# Patient Record
Sex: Female | Born: 1945 | Race: White | Hispanic: No | Marital: Married | Smoking: Never smoker
Health system: Southern US, Community
[De-identification: ages and names within clinical notes are randomized; demographics above are authoritative.]

## PROBLEM LIST (undated history)

## (undated) HISTORY — PX: ABDOMINAL HYSTERECTOMY: SHX81

---

## 1988-03-22 HISTORY — PX: APPENDECTOMY: SHX54

## 2014-01-17 ENCOUNTER — Encounter (INDEPENDENT_AMBULATORY_CARE_PROVIDER_SITE_OTHER): Payer: Self-pay | Admitting: *Deleted

## 2015-04-23 ENCOUNTER — Encounter (INDEPENDENT_AMBULATORY_CARE_PROVIDER_SITE_OTHER): Payer: Self-pay | Admitting: *Deleted

## 2015-07-25 ENCOUNTER — Other Ambulatory Visit (INDEPENDENT_AMBULATORY_CARE_PROVIDER_SITE_OTHER): Payer: Self-pay | Admitting: *Deleted

## 2015-07-25 ENCOUNTER — Encounter (INDEPENDENT_AMBULATORY_CARE_PROVIDER_SITE_OTHER): Payer: Self-pay | Admitting: *Deleted

## 2015-07-25 DIAGNOSIS — Z1211 Encounter for screening for malignant neoplasm of colon: Secondary | ICD-10-CM

## 2015-09-24 ENCOUNTER — Other Ambulatory Visit (INDEPENDENT_AMBULATORY_CARE_PROVIDER_SITE_OTHER): Payer: Self-pay | Admitting: *Deleted

## 2015-09-24 ENCOUNTER — Encounter (INDEPENDENT_AMBULATORY_CARE_PROVIDER_SITE_OTHER): Payer: Self-pay | Admitting: *Deleted

## 2015-09-24 NOTE — Telephone Encounter (Signed)
Patient needs trilyte 

## 2015-09-25 MED ORDER — PEG 3350-KCL-NA BICARB-NACL 420 G PO SOLR: 4000.0000 mL | Freq: Once | ORAL | Status: DC

## 2015-10-08 ENCOUNTER — Telehealth (INDEPENDENT_AMBULATORY_CARE_PROVIDER_SITE_OTHER): Payer: Self-pay | Admitting: *Deleted

## 2015-10-08 NOTE — Telephone Encounter (Signed)
agere 

## 2015-10-08 NOTE — Telephone Encounter (Signed)
Referring MD: mcleod PCP: sasser   Procedure: tcs  Reason/Indication:  screening  Has patient had this procedure before?  Yes, 8-10 yrs ago  If so, when, by whom and where?    Is there a family history of colon cancer?  no  Who?  What age when diagnosed?    Is patient diabetic?   no      Does patient have prosthetic heart valve or mechanical valve?  no  Do you have a pacemaker?  no  Has patient ever had endocarditis? no  Has patient had joint replacement within last 12 months?  no  Does patient tend to be constipated or take laxatives? no  Does patient have a history of alcohol/drug use?  no  Is patient on Coumadin, Plavix and/or Aspirin? no  Medications: estradiol 1 mg daily, tums 1000 mg daily, tylenol ES daily, vit c 500 mg bid, glucosamine bid, multi vit daily, cranberry daily, fish oil 300 mg daily, ocuvite nightly, claritin 10 mg daily, opcon eye drop prn  Allergies: nkda  Medication Adjustment:   Procedure date & time: 10/30/15 at 930

## 2015-10-30 ENCOUNTER — Ambulatory Visit (HOSPITAL_COMMUNITY)
Admission: RE | Admit: 2015-10-30 | Discharge: 2015-10-30 | Disposition: A | Discharge status: Home / Self Care | Payer: PPO | Source: Ambulatory Visit | Attending: Internal Medicine | Admitting: Internal Medicine

## 2015-10-30 ENCOUNTER — Encounter (HOSPITAL_COMMUNITY): Payer: Self-pay

## 2015-10-30 ENCOUNTER — Encounter (HOSPITAL_COMMUNITY)
Admission: RE | Disposition: A | Discharge status: Home / Self Care | Payer: Self-pay | Source: Ambulatory Visit | Attending: Internal Medicine

## 2015-10-30 DIAGNOSIS — K644 Residual hemorrhoidal skin tags: Secondary | ICD-10-CM | POA: Insufficient documentation

## 2015-10-30 DIAGNOSIS — Z79899 Other long term (current) drug therapy: Secondary | ICD-10-CM | POA: Diagnosis not present

## 2015-10-30 DIAGNOSIS — K6289 Other specified diseases of anus and rectum: Secondary | ICD-10-CM | POA: Insufficient documentation

## 2015-10-30 DIAGNOSIS — D120 Benign neoplasm of cecum: Secondary | ICD-10-CM | POA: Diagnosis not present

## 2015-10-30 DIAGNOSIS — Z1211 Encounter for screening for malignant neoplasm of colon: Secondary | ICD-10-CM | POA: Insufficient documentation

## 2015-10-30 HISTORY — PX: COLONOSCOPY: SHX5424

## 2015-10-30 HISTORY — PX: POLYPECTOMY: SHX5525

## 2015-10-30 SURGERY — COLONOSCOPY
Anesthesia: Moderate Sedation

## 2015-10-30 MED ORDER — SODIUM CHLORIDE 0.9 % IV SOLN
INTRAVENOUS | Status: DC
  Administered 2015-10-30: 08:00:00 via INTRAVENOUS

## 2015-10-30 MED ORDER — MIDAZOLAM HCL 5 MG/5ML IJ SOLN
INTRAMUSCULAR | Status: DC | PRN
  Administered 2015-10-30 (×2): 1 mg via INTRAVENOUS
  Administered 2015-10-30 (×2): 2 mg via INTRAVENOUS

## 2015-10-30 MED ORDER — MEPERIDINE HCL 50 MG/ML IJ SOLN
INTRAMUSCULAR | Status: AC
  Filled 2015-10-30: qty 1

## 2015-10-30 MED ORDER — MIDAZOLAM HCL 5 MG/5ML IJ SOLN
INTRAMUSCULAR | Status: AC
  Filled 2015-10-30: qty 10

## 2015-10-30 MED ORDER — MEPERIDINE HCL 50 MG/ML IJ SOLN
INTRAMUSCULAR | Status: DC | PRN
  Administered 2015-10-30 (×2): 25 mg via INTRAVENOUS

## 2015-10-30 MED ORDER — STERILE WATER FOR IRRIGATION IR SOLN
Status: DC | PRN
  Administered 2015-10-30: 09:00:00

## 2015-10-30 NOTE — H&P (Signed)
Michelle Arias is an 70 y.o. female.   Chief Complaint: Patient is here for colonoscopy. HPI: Patient is [age]-year-old Caucasian female who is here for screening colonoscopy. She denies abdominal pain change in habits or rectal bleeding. Family history is negative for CRC.  History reviewed. No pertinent past medical history.  Past Surgical History:  Procedure Laterality Date  . ABDOMINAL HYSTERECTOMY    . APPENDECTOMY  1990    History reviewed. No pertinent family history. Social History:  reports that she has never smoked. She has never used smokeless tobacco. She reports that she does not drink alcohol or use drugs.  Allergies: No Known Allergies  Medications Prior to Admission  Medication Sig Dispense Refill  . acetaminophen (TYLENOL) 500 MG tablet Take 500 mg by mouth every 6 (six) hours as needed.    . calcium carbonate (TUMS - DOSED IN MG ELEMENTAL CALCIUM) 500 MG chewable tablet Chew 1 tablet by mouth daily.    Marland Kitchen co-enzyme Q-10 30 MG capsule Take 30 mg by mouth 3 (three) times daily.    Marland Kitchen estradiol (ESTRACE) 1 MG tablet Take 1 mg by mouth daily.    Marland Kitchen glucosamine-chondroitin 500-400 MG tablet Take 1 tablet by mouth 3 (three) times daily.    Marland Kitchen loratadine (CLARITIN) 10 MG tablet Take 10 mg by mouth daily.    . Multiple Vitamin (MULTIVITAMIN) tablet Take 1 tablet by mouth daily.    . multivitamin-lutein (OCUVITE-LUTEIN) CAPS capsule Take 1 capsule by mouth daily.    . polyethylene glycol-electrolytes (TRILYTE) 420 g solution Take 4,000 mLs by mouth once. 4000 mL 0    No results found for this or any previous visit (from the past 48 hour(s)). No results found.  ROS  Blood pressure (!) 149/82, pulse 99, temperature 98.1 F (36.7 C), temperature source Oral, resp. rate 16, height 5\' 5"  (1.651 m), weight 165 lb (74.8 kg), SpO2 99 %. Physical Exam  Constitutional: She appears well-developed and well-nourished.  HENT:  Mouth/Throat: Oropharynx is clear and moist.  Eyes:  Conjunctivae are normal. No scleral icterus.  Neck: No thyromegaly present.  Cardiovascular: Normal rate, regular rhythm and normal heart sounds.   No murmur heard. Respiratory: Effort normal and breath sounds normal.  GI: Soft. She exhibits no distension and no mass. There is no tenderness.  Musculoskeletal: She exhibits no edema.  Lymphadenopathy:    She has no cervical adenopathy.  Neurological: She is alert.  Skin: Skin is warm and dry.     Assessment/Plan Average risk screening colonoscopy.  Hildred Laser, MD 10/30/2015, 9:21 AM

## 2015-10-30 NOTE — Op Note (Signed)
[facility] Patient Name: Treba Arias Procedure Date: 10/30/2015 9:18 AM MRN: [id number] Date of Birth: 1945/07/05 Attending MD: Hildred Laser , MD CSN: [id number] Age: 70 Admit Type: Outpatient Procedure:                Colonoscopy Indications:              Screening for colorectal malignant neoplasm Providers:                Hildred Laser, MD, Otis Peak B. Sharon Seller, RN, Shelby Mattocks, Technician, Isabella Stalling, Technician Referring MD:             Manon Hilding MD, MD Medicines:                Meperidine 50 mg IV, Midazolam 6 mg IV Complications:            No immediate complications. Estimated Blood Loss:     Estimated blood loss was minimal. Procedure:                Pre-Anesthesia Assessment:                           - Prior to the procedure, a History and Physical                            was performed, and patient medications and                            allergies were reviewed. The patient's tolerance of                            previous anesthesia was also reviewed. The risks                            and benefits of the procedure and the sedation                            options and risks were discussed with the patient.                            All questions were answered, and informed consent                            was obtained. Prior Anticoagulants: The patient has                            taken no previous anticoagulant or antiplatelet                            agents. ASA Grade Assessment: I - A normal, healthy                            patient. After reviewing the risks and benefits,  the patient was deemed in satisfactory condition to                            undergo the procedure.                           After obtaining informed consent, the colonoscope                            was passed under direct vision. Throughout the                            procedure, the patient's blood  pressure, pulse, and                            oxygen saturations were monitored continuously. The                            EC-[id number]) scope was introduced through                            the anus and advanced to the the cecum, identified                            by appendiceal orifice and ileocecal valve. The                            colonoscopy was performed without difficulty. The                            patient tolerated the procedure well. The quality                            of the bowel preparation was good. The ileocecal                            valve, appendiceal orifice, and rectum were                            photographed. Scope In: 9:30:46 AM Scope Out: 9:52:18 AM Scope Withdrawal Time: 0 hours 14 minutes 56 seconds  Total Procedure Duration: 0 hours 21 minutes 32 seconds  Findings:      A 3 mm polyp was found in the cecum. The polyp was sessile. This was       biopsied with a cold forceps for histology. The pathology specimen was       placed into Bottle Number 1.      A 5 by 8 mm polyp was found in the cecum. The polyp was sessile. The       polyp was removed with a cold snare. Resection and retrieval were       complete. To stop active bleeding, one hemostatic clip was successfully       placed (MR conditional). There was no bleeding at the end of the       procedure. athology specimen was placed in bottle  1.      The exam was otherwise without abnormality.      External hemorrhoids were found during retroflexion. The hemorrhoids       were small.      Anal papilla(e) were hypertrophied. Impression:               - One 3 mm polyp in the cecum. Biopsied.                           - One 5 by 8 mm polyp in the cecum, removed with a                            cold snare. Resected and retrieved. Clip (MR                            conditional) was placed.                           - Both polyps were submi                           - The  examination was otherwise normal.                           - External hemorrhoids.                           - Anal papilla(e) were hypertrophied. Moderate Sedation:      Moderate (conscious) sedation was administered by the endoscopy nurse       and supervised by the endoscopist. The following parameters were       monitored: oxygen saturation, heart rate, blood pressure, CO2       capnography and response to care. Total physician intraservice time was       27 minutes. Recommendation:           - Patient has a contact number available for                            emergencies. The signs and symptoms of potential                            delayed complications were discussed with the                            patient. Return to normal activities tomorrow.                            Written discharge instructions were provided to the                            patient.                           - Resume previous diet today.                           -  Continue present medications.                           - No aspirin, ibuprofen, naproxen, or other                            non-steroidal anti-inflammatory drugs for 5 days                            after polyp removal.                           - Await pathology results.                           - Repeat colonoscopy for surveillance based on                            pathology results. Procedure Code(s):        --- Professional ---                           [phone], Colonoscopy, flexible; with removal of                            tumor(s), polyp(s), or other lesion(s) by snare                            technique                           45380, 59, Colonoscopy, flexible; with biopsy,                            single or multiple                           99152, Moderate sedation services provided by the                            same physician or other qualified health care                            professional performing  the diagnostic or                            therapeutic service that the sedation supports,                            requiring the presence of an independent trained                            observer to assist in the monitoring of the                            patient's level of consciousness and physiological  status; initial 15 minutes of intraservice time,                            patient age 25 years or older                           [phone], Moderate sedation services; each additional                            15 minutes intraservice time Diagnosis Code(s):        --- Professional ---                           Z12.11, Encounter for screening for malignant                            neoplasm of colon                           D12.0, Benign neoplasm of cecum                           K64.4, Residual hemorrhoidal skin tags                           K62.89, Other specified diseases of anus and rectum CPT copyright 2016 American Medical Association. All rights reserved. The codes documented in this report are preliminary and upon coder review may  be revised to meet current compliance requirements. Hildred Laser, MD Hildred Laser, MD 10/30/2015 10:01:06 AM This report has been signed electronically. Number of Addenda: 0

## 2015-10-30 NOTE — Discharge Instructions (Signed)
No aspirin or NSAIDs for 5 days. Resume usual medications and diet. No driving for 24 hours. Physician will call with biopsy results.   Colonoscopy, Care After These instructions give you information on caring for yourself after your procedure. Your doctor may also give you more specific instructions. Call your doctor if you have any problems or questions after your procedure. HOME CARE  Do not drive for 24 hours.  Do not sign important papers or use machinery for 24 hours.  You may shower.  You may go back to your usual activities, but go slower for the first 24 hours.  Take rest breaks often during the first 24 hours.  Walk around or use warm packs on your belly (abdomen) if you have belly cramping or gas.  Drink enough fluids to keep your pee (urine) clear or pale yellow.  Resume your normal diet. Avoid heavy or fried foods.  Avoid drinking alcohol for 24 hours or as told by your doctor.  Only take medicines as told by your doctor. If a tissue sample (biopsy) was taken during the procedure:   Do not take aspirin or blood thinners for 7 days, or as told by your doctor.  Do not drink alcohol for 7 days, or as told by your doctor.  Eat soft foods for the first 24 hours. GET HELP IF: You still have a small amount of blood in your poop (stool) 2-3 days after the procedure. GET HELP RIGHT AWAY IF:  You have more than a small amount of blood in your poop.  You see clumps of tissue (blood clots) in your poop.  Your belly is puffy (swollen).  You feel sick to your stomach (nauseous) or throw up (vomit).  You have a fever.  You have belly pain that gets worse and medicine does not help. MAKE SURE YOU:  Understand these instructions.  Will watch your condition.  Will get help right away if you are not doing well or get worse.   This information is not intended to replace advice given to you by your health care provider. Make sure you discuss any questions you have  with your health care provider.   Document Released: 04/10/2010 Document Revised: 03/13/2013 Document Reviewed: 11/13/2012 Elsevier Interactive Patient Education 2016 Elsevier Inc[employer]  Colon Polyps Polyps are lumps of extra tissue growing inside the body. Polyps can grow in the large intestine (colon). Most colon polyps are noncancerous (benign). However, some colon polyps can become cancerous over time. Polyps that are larger than a pea may be harmful. To be safe, caregivers remove and test all polyps. CAUSES  Polyps form when mutations in the genes cause your cells to grow and divide even though no more tissue is needed. RISK FACTORS There are a number of risk factors that can increase your chances of getting colon polyps. They include:  Being older than 50 years.  Family history of colon polyps or colon cancer.  Long-term colon diseases, such as colitis or Crohn disease.  Being overweight.  Smoking.  Being inactive.  Drinking too much alcohol. SYMPTOMS  Most small polyps do not cause symptoms. If symptoms are present, they may include:  Blood in the stool. The stool may look dark red or black.  Constipation or diarrhea that lasts longer than 1 week. DIAGNOSIS People often do not know they have polyps until their caregiver finds them during a regular checkup. Your caregiver can use 4 tests to check for polyps:  Digital rectal exam. The caregiver wears  gloves and feels inside the rectum. This test would find polyps only in the rectum.  Barium enema. The caregiver puts a liquid called barium into your rectum before taking X-rays of your colon. Barium makes your colon look white. Polyps are dark, so they are easy to see in the X-ray pictures.  Sigmoidoscopy. A thin, flexible tube (sigmoidoscope) is placed into your rectum. The sigmoidoscope has a light and tiny camera in it. The caregiver uses the sigmoidoscope to look at the last third of your colon.  Colonoscopy. This test is  like sigmoidoscopy, but the caregiver looks at the entire colon. This is the most common method for finding and removing polyps. TREATMENT  Any polyps will be removed during a sigmoidoscopy or colonoscopy. The polyps are then tested for cancer. PREVENTION  To help lower your risk of getting more colon polyps:  Eat plenty of fruits and vegetables. Avoid eating fatty foods.  Do not smoke.  Avoid drinking alcohol.  Exercise every day.  Lose weight if recommended by your caregiver.  Eat plenty of calcium and folate. Foods that are rich in calcium include milk, cheese, and broccoli. Foods that are rich in folate include chickpeas, kidney beans, and spinach. HOME CARE INSTRUCTIONS Keep all follow-up appointments as directed by your caregiver. You may need periodic exams to check for polyps. SEEK MEDICAL CARE IF: You notice bleeding during a bowel movement.   This information is not intended to replace advice given to you by your health care provider. Make sure you discuss any questions you have with your health care provider.   Document Released: 12/03/2003 Document Revised: 03/29/2014 Document Reviewed: 05/18/2011 Elsevier Interactive Patient Education 2016 [employer].   Hemorrhoids Hemorrhoids are swollen veins around the rectum or anus. There are two types of hemorrhoids:   Internal hemorrhoids. These occur in the veins just inside the rectum. They may poke through to the outside and become irritated and painful.  External hemorrhoids. These occur in the veins outside the anus and can be felt as a painful swelling or hard lump near the anus. CAUSES  Pregnancy.   Obesity.   Constipation or diarrhea.   Straining to have a bowel movement.   Sitting for long periods on the toilet.  Heavy lifting or other activity that caused you to strain.  Anal intercourse. SYMPTOMS   Pain.   Anal itching or irritation.   Rectal bleeding.   Fecal leakage.   Anal  swelling.   One or more lumps around the anus.  DIAGNOSIS  Your caregiver may be able to diagnose hemorrhoids by visual examination. Other examinations or tests that may be performed include:   Examination of the rectal area with a gloved hand (digital rectal exam).   Examination of anal canal using a small tube (scope).   A blood test if you have lost a significant amount of blood.  A test to look inside the colon (sigmoidoscopy or colonoscopy). TREATMENT Most hemorrhoids can be treated at home. However, if symptoms do not seem to be getting better or if you have a lot of rectal bleeding, your caregiver may perform a procedure to help make the hemorrhoids get smaller or remove them completely. Possible treatments include:   Placing a rubber band at the base of the hemorrhoid to cut off the circulation (rubber band ligation).   Injecting a chemical to shrink the hemorrhoid (sclerotherapy).   Using a tool to burn the hemorrhoid (infrared light therapy).   Surgically removing the hemorrhoid (hemorrhoidectomy).  Stapling the hemorrhoid to block blood flow to the tissue (hemorrhoid stapling).  HOME CARE INSTRUCTIONS   Eat foods with fiber, such as whole grains, beans, nuts, fruits, and vegetables. Ask your doctor about taking products with added fiber in them (fibersupplements).  Increase fluid intake. Drink enough water and fluids to keep your urine clear or pale yellow.   Exercise regularly.   Go to the bathroom when you have the urge to have a bowel movement. Do not wait.   Avoid straining to have bowel movements.   Keep the anal area dry and clean. Use wet toilet paper or moist towelettes after a bowel movement.   Medicated creams and suppositories may be used or applied as directed.   Only take over-the-counter or prescription medicines as directed by your caregiver.   Take warm sitz baths for 15-20 minutes, 3-4 times a day to ease pain and discomfort.    Place ice packs on the hemorrhoids if they are tender and swollen. Using ice packs between sitz baths may be helpful.   Put ice in a plastic bag.   Place a towel between your skin and the bag.   Leave the ice on for 15-20 minutes, 3-4 times a day.   Do not use a donut-shaped pillow or sit on the toilet for long periods. This increases blood pooling and pain.  SEEK MEDICAL CARE IF:  You have increasing pain and swelling that is not controlled by treatment or medicine.  You have uncontrolled bleeding.  You have difficulty or you are unable to have a bowel movement.  You have pain or inflammation outside the area of the hemorrhoids. MAKE SURE YOU:  Understand these instructions.  Will watch your condition.  Will get help right away if you are not doing well or get worse.   This information is not intended to replace advice given to you by your health care provider. Make sure you discuss any questions you have with your health care provider.   Document Released: 03/05/2000 Document Revised: 02/23/2012 Document Reviewed: 01/11/2012 Elsevier Interactive Patient Education [employer].

## 2015-11-03 ENCOUNTER — Encounter (HOSPITAL_COMMUNITY): Payer: Self-pay | Admitting: Internal Medicine

## 2016-03-18 DIAGNOSIS — Z1231 Encounter for screening mammogram for malignant neoplasm of breast: Secondary | ICD-10-CM | POA: Diagnosis not present

## 2016-03-25 DIAGNOSIS — Z6829 Body mass index (BMI) 29.0-29.9, adult: Secondary | ICD-10-CM | POA: Diagnosis not present

## 2016-03-25 DIAGNOSIS — Z01419 Encounter for gynecological examination (general) (routine) without abnormal findings: Secondary | ICD-10-CM | POA: Diagnosis not present

## 2016-03-25 DIAGNOSIS — R1012 Left upper quadrant pain: Secondary | ICD-10-CM | POA: Diagnosis not present

## 2016-03-25 DIAGNOSIS — Z1272 Encounter for screening for malignant neoplasm of vagina: Secondary | ICD-10-CM | POA: Diagnosis not present

## 2016-03-25 DIAGNOSIS — L900 Lichen sclerosus et atrophicus: Secondary | ICD-10-CM | POA: Diagnosis not present

## 2016-04-08 DIAGNOSIS — R1012 Left upper quadrant pain: Secondary | ICD-10-CM | POA: Diagnosis not present

## 2016-04-08 DIAGNOSIS — K7689 Other specified diseases of liver: Secondary | ICD-10-CM | POA: Diagnosis not present

## 2016-04-08 DIAGNOSIS — Z6829 Body mass index (BMI) 29.0-29.9, adult: Secondary | ICD-10-CM | POA: Diagnosis not present

## 2017-02-03 DIAGNOSIS — M79671 Pain in right foot: Secondary | ICD-10-CM | POA: Diagnosis not present

## 2017-02-03 DIAGNOSIS — M722 Plantar fascial fibromatosis: Secondary | ICD-10-CM | POA: Diagnosis not present

## 2017-02-11 DIAGNOSIS — M722 Plantar fascial fibromatosis: Secondary | ICD-10-CM | POA: Diagnosis not present

## 2017-02-11 DIAGNOSIS — K219 Gastro-esophageal reflux disease without esophagitis: Secondary | ICD-10-CM | POA: Diagnosis not present

## 2017-02-11 DIAGNOSIS — Z299 Encounter for prophylactic measures, unspecified: Secondary | ICD-10-CM | POA: Diagnosis not present

## 2017-02-11 DIAGNOSIS — Z79890 Hormone replacement therapy (postmenopausal): Secondary | ICD-10-CM | POA: Diagnosis not present

## 2017-02-11 DIAGNOSIS — J309 Allergic rhinitis, unspecified: Secondary | ICD-10-CM | POA: Diagnosis not present

## 2017-02-11 DIAGNOSIS — Z6832 Body mass index (BMI) 32.0-32.9, adult: Secondary | ICD-10-CM | POA: Diagnosis not present

## 2017-03-03 DIAGNOSIS — M79672 Pain in left foot: Secondary | ICD-10-CM | POA: Diagnosis not present

## 2017-03-03 DIAGNOSIS — M722 Plantar fascial fibromatosis: Secondary | ICD-10-CM | POA: Diagnosis not present

## 2017-03-04 DIAGNOSIS — Z7189 Other specified counseling: Secondary | ICD-10-CM | POA: Diagnosis not present

## 2017-03-04 DIAGNOSIS — E7800 Pure hypercholesterolemia, unspecified: Secondary | ICD-10-CM | POA: Diagnosis not present

## 2017-03-04 DIAGNOSIS — Z1331 Encounter for screening for depression: Secondary | ICD-10-CM | POA: Diagnosis not present

## 2017-03-04 DIAGNOSIS — Z0000 Encounter for general adult medical examination without abnormal findings: Secondary | ICD-10-CM | POA: Diagnosis not present

## 2017-03-04 DIAGNOSIS — Z299 Encounter for prophylactic measures, unspecified: Secondary | ICD-10-CM | POA: Diagnosis not present

## 2017-03-04 DIAGNOSIS — Z1339 Encounter for screening examination for other mental health and behavioral disorders: Secondary | ICD-10-CM | POA: Diagnosis not present

## 2017-03-04 DIAGNOSIS — Z87891 Personal history of nicotine dependence: Secondary | ICD-10-CM | POA: Diagnosis not present

## 2017-03-04 DIAGNOSIS — Z79899 Other long term (current) drug therapy: Secondary | ICD-10-CM | POA: Diagnosis not present

## 2017-03-04 DIAGNOSIS — R5383 Other fatigue: Secondary | ICD-10-CM | POA: Diagnosis not present

## 2017-03-04 DIAGNOSIS — Z6832 Body mass index (BMI) 32.0-32.9, adult: Secondary | ICD-10-CM | POA: Diagnosis not present

## 2017-03-04 DIAGNOSIS — E2839 Other primary ovarian failure: Secondary | ICD-10-CM | POA: Diagnosis not present

## 2017-03-08 DIAGNOSIS — R5383 Other fatigue: Secondary | ICD-10-CM | POA: Diagnosis not present

## 2017-03-08 DIAGNOSIS — E7800 Pure hypercholesterolemia, unspecified: Secondary | ICD-10-CM | POA: Diagnosis not present

## 2017-03-08 DIAGNOSIS — Z79899 Other long term (current) drug therapy: Secondary | ICD-10-CM | POA: Diagnosis not present

## 2017-03-24 DIAGNOSIS — E2839 Other primary ovarian failure: Secondary | ICD-10-CM | POA: Diagnosis not present

## 2017-03-29 DIAGNOSIS — Z01419 Encounter for gynecological examination (general) (routine) without abnormal findings: Secondary | ICD-10-CM | POA: Diagnosis not present

## 2017-04-01 DIAGNOSIS — Z6831 Body mass index (BMI) 31.0-31.9, adult: Secondary | ICD-10-CM | POA: Diagnosis not present

## 2017-04-01 DIAGNOSIS — L570 Actinic keratosis: Secondary | ICD-10-CM | POA: Diagnosis not present

## 2017-04-01 DIAGNOSIS — B079 Viral wart, unspecified: Secondary | ICD-10-CM | POA: Diagnosis not present

## 2017-04-01 DIAGNOSIS — B078 Other viral warts: Secondary | ICD-10-CM | POA: Diagnosis not present

## 2017-04-01 DIAGNOSIS — Z1231 Encounter for screening mammogram for malignant neoplasm of breast: Secondary | ICD-10-CM | POA: Diagnosis not present

## 2017-04-01 DIAGNOSIS — L820 Inflamed seborrheic keratosis: Secondary | ICD-10-CM | POA: Diagnosis not present

## 2017-04-01 DIAGNOSIS — Z299 Encounter for prophylactic measures, unspecified: Secondary | ICD-10-CM | POA: Diagnosis not present

## 2017-04-27 DIAGNOSIS — B079 Viral wart, unspecified: Secondary | ICD-10-CM | POA: Diagnosis not present

## 2017-04-27 DIAGNOSIS — Z6831 Body mass index (BMI) 31.0-31.9, adult: Secondary | ICD-10-CM | POA: Diagnosis not present

## 2017-04-27 DIAGNOSIS — Z299 Encounter for prophylactic measures, unspecified: Secondary | ICD-10-CM | POA: Diagnosis not present

## 2017-05-09 DIAGNOSIS — Z1329 Encounter for screening for other suspected endocrine disorder: Secondary | ICD-10-CM | POA: Diagnosis not present

## 2017-05-09 DIAGNOSIS — E088 Diabetes mellitus due to underlying condition with unspecified complications: Secondary | ICD-10-CM | POA: Diagnosis not present

## 2017-05-09 DIAGNOSIS — I4891 Unspecified atrial fibrillation: Secondary | ICD-10-CM | POA: Diagnosis not present

## 2017-05-09 DIAGNOSIS — Z13220 Encounter for screening for lipoid disorders: Secondary | ICD-10-CM | POA: Diagnosis not present

## 2017-05-09 DIAGNOSIS — E785 Hyperlipidemia, unspecified: Secondary | ICD-10-CM | POA: Diagnosis not present

## 2017-05-09 DIAGNOSIS — I10 Essential (primary) hypertension: Secondary | ICD-10-CM | POA: Diagnosis not present

## 2017-08-30 DIAGNOSIS — H43822 Vitreomacular adhesion, left eye: Secondary | ICD-10-CM | POA: Diagnosis not present

## 2017-08-30 DIAGNOSIS — H40013 Open angle with borderline findings, low risk, bilateral: Secondary | ICD-10-CM | POA: Diagnosis not present

## 2017-08-30 DIAGNOSIS — H2513 Age-related nuclear cataract, bilateral: Secondary | ICD-10-CM | POA: Diagnosis not present

## 2018-02-09 DIAGNOSIS — I480 Paroxysmal atrial fibrillation: Secondary | ICD-10-CM | POA: Diagnosis not present

## 2018-02-09 DIAGNOSIS — E088 Diabetes mellitus due to underlying condition with unspecified complications: Secondary | ICD-10-CM | POA: Diagnosis not present

## 2018-02-09 DIAGNOSIS — I10 Essential (primary) hypertension: Secondary | ICD-10-CM | POA: Diagnosis not present

## 2018-02-09 DIAGNOSIS — M24572 Contracture, left ankle: Secondary | ICD-10-CM | POA: Diagnosis not present

## 2018-02-09 DIAGNOSIS — G4730 Sleep apnea, unspecified: Secondary | ICD-10-CM | POA: Diagnosis not present

## 2018-03-08 DIAGNOSIS — Z6831 Body mass index (BMI) 31.0-31.9, adult: Secondary | ICD-10-CM | POA: Diagnosis not present

## 2018-03-08 DIAGNOSIS — Z1211 Encounter for screening for malignant neoplasm of colon: Secondary | ICD-10-CM | POA: Diagnosis not present

## 2018-03-08 DIAGNOSIS — Z1331 Encounter for screening for depression: Secondary | ICD-10-CM | POA: Diagnosis not present

## 2018-03-08 DIAGNOSIS — Z79899 Other long term (current) drug therapy: Secondary | ICD-10-CM | POA: Diagnosis not present

## 2018-03-08 DIAGNOSIS — Z0000 Encounter for general adult medical examination without abnormal findings: Secondary | ICD-10-CM | POA: Diagnosis not present

## 2018-03-08 DIAGNOSIS — Z1339 Encounter for screening examination for other mental health and behavioral disorders: Secondary | ICD-10-CM | POA: Diagnosis not present

## 2018-03-08 DIAGNOSIS — R5383 Other fatigue: Secondary | ICD-10-CM | POA: Diagnosis not present

## 2018-03-08 DIAGNOSIS — Z7189 Other specified counseling: Secondary | ICD-10-CM | POA: Diagnosis not present

## 2018-03-08 DIAGNOSIS — Z299 Encounter for prophylactic measures, unspecified: Secondary | ICD-10-CM | POA: Diagnosis not present

## 2018-03-08 DIAGNOSIS — E7800 Pure hypercholesterolemia, unspecified: Secondary | ICD-10-CM | POA: Diagnosis not present

## 2018-04-03 DIAGNOSIS — Z1231 Encounter for screening mammogram for malignant neoplasm of breast: Secondary | ICD-10-CM | POA: Diagnosis not present

## 2018-05-06 DIAGNOSIS — J0100 Acute maxillary sinusitis, unspecified: Secondary | ICD-10-CM | POA: Diagnosis not present

## 2018-05-06 DIAGNOSIS — R509 Fever, unspecified: Secondary | ICD-10-CM | POA: Diagnosis not present

## 2018-05-19 DIAGNOSIS — Z6831 Body mass index (BMI) 31.0-31.9, adult: Secondary | ICD-10-CM | POA: Diagnosis not present

## 2018-05-19 DIAGNOSIS — I10 Essential (primary) hypertension: Secondary | ICD-10-CM | POA: Diagnosis not present

## 2018-05-19 DIAGNOSIS — Z299 Encounter for prophylactic measures, unspecified: Secondary | ICD-10-CM | POA: Diagnosis not present

## 2018-09-12 DIAGNOSIS — H40013 Open angle with borderline findings, low risk, bilateral: Secondary | ICD-10-CM | POA: Diagnosis not present

## 2018-09-12 DIAGNOSIS — H2513 Age-related nuclear cataract, bilateral: Secondary | ICD-10-CM | POA: Diagnosis not present

## 2018-09-12 DIAGNOSIS — H43822 Vitreomacular adhesion, left eye: Secondary | ICD-10-CM | POA: Diagnosis not present

## 2019-03-14 DIAGNOSIS — R5383 Other fatigue: Secondary | ICD-10-CM | POA: Diagnosis not present

## 2019-03-14 DIAGNOSIS — E7800 Pure hypercholesterolemia, unspecified: Secondary | ICD-10-CM | POA: Diagnosis not present

## 2019-03-14 DIAGNOSIS — Z6831 Body mass index (BMI) 31.0-31.9, adult: Secondary | ICD-10-CM | POA: Diagnosis not present

## 2019-03-14 DIAGNOSIS — Z299 Encounter for prophylactic measures, unspecified: Secondary | ICD-10-CM | POA: Diagnosis not present

## 2019-03-14 DIAGNOSIS — Z0000 Encounter for general adult medical examination without abnormal findings: Secondary | ICD-10-CM | POA: Diagnosis not present

## 2019-03-14 DIAGNOSIS — I10 Essential (primary) hypertension: Secondary | ICD-10-CM | POA: Diagnosis not present

## 2019-03-14 DIAGNOSIS — Z79899 Other long term (current) drug therapy: Secondary | ICD-10-CM | POA: Diagnosis not present

## 2019-03-14 DIAGNOSIS — Z1211 Encounter for screening for malignant neoplasm of colon: Secondary | ICD-10-CM | POA: Diagnosis not present

## 2019-03-14 DIAGNOSIS — Z1339 Encounter for screening examination for other mental health and behavioral disorders: Secondary | ICD-10-CM | POA: Diagnosis not present

## 2019-03-14 DIAGNOSIS — Z7189 Other specified counseling: Secondary | ICD-10-CM | POA: Diagnosis not present

## 2019-03-14 DIAGNOSIS — Z1331 Encounter for screening for depression: Secondary | ICD-10-CM | POA: Diagnosis not present

## 2019-05-28 DIAGNOSIS — Z1231 Encounter for screening mammogram for malignant neoplasm of breast: Secondary | ICD-10-CM | POA: Diagnosis not present

## 2019-06-06 DIAGNOSIS — R922 Inconclusive mammogram: Secondary | ICD-10-CM | POA: Diagnosis not present

## 2019-06-06 DIAGNOSIS — N6489 Other specified disorders of breast: Secondary | ICD-10-CM | POA: Diagnosis not present

## 2019-06-12 DIAGNOSIS — Z01419 Encounter for gynecological examination (general) (routine) without abnormal findings: Secondary | ICD-10-CM | POA: Diagnosis not present

## 2019-06-21 DIAGNOSIS — Z87891 Personal history of nicotine dependence: Secondary | ICD-10-CM | POA: Diagnosis not present

## 2019-06-21 DIAGNOSIS — I10 Essential (primary) hypertension: Secondary | ICD-10-CM | POA: Diagnosis not present

## 2019-06-21 DIAGNOSIS — K219 Gastro-esophageal reflux disease without esophagitis: Secondary | ICD-10-CM | POA: Diagnosis not present

## 2019-06-21 DIAGNOSIS — Z299 Encounter for prophylactic measures, unspecified: Secondary | ICD-10-CM | POA: Diagnosis not present

## 2019-07-20 DIAGNOSIS — I10 Essential (primary) hypertension: Secondary | ICD-10-CM | POA: Diagnosis not present

## 2019-08-01 DIAGNOSIS — Z299 Encounter for prophylactic measures, unspecified: Secondary | ICD-10-CM | POA: Diagnosis not present

## 2019-08-01 DIAGNOSIS — Z6831 Body mass index (BMI) 31.0-31.9, adult: Secondary | ICD-10-CM | POA: Diagnosis not present

## 2019-08-01 DIAGNOSIS — M25561 Pain in right knee: Secondary | ICD-10-CM | POA: Diagnosis not present

## 2019-08-01 DIAGNOSIS — E7800 Pure hypercholesterolemia, unspecified: Secondary | ICD-10-CM | POA: Diagnosis not present

## 2019-08-01 DIAGNOSIS — I10 Essential (primary) hypertension: Secondary | ICD-10-CM | POA: Diagnosis not present

## 2019-08-19 DIAGNOSIS — I10 Essential (primary) hypertension: Secondary | ICD-10-CM | POA: Diagnosis not present

## 2019-09-18 DIAGNOSIS — I10 Essential (primary) hypertension: Secondary | ICD-10-CM | POA: Diagnosis not present

## 2019-09-25 DIAGNOSIS — H43822 Vitreomacular adhesion, left eye: Secondary | ICD-10-CM | POA: Diagnosis not present

## 2019-09-25 DIAGNOSIS — H40013 Open angle with borderline findings, low risk, bilateral: Secondary | ICD-10-CM | POA: Diagnosis not present

## 2019-09-25 DIAGNOSIS — H2513 Age-related nuclear cataract, bilateral: Secondary | ICD-10-CM | POA: Diagnosis not present

## 2019-09-25 DIAGNOSIS — D3192 Benign neoplasm of unspecified part of left eye: Secondary | ICD-10-CM | POA: Diagnosis not present

## 2019-09-26 DIAGNOSIS — I10 Essential (primary) hypertension: Secondary | ICD-10-CM | POA: Diagnosis not present

## 2019-09-26 DIAGNOSIS — Z713 Dietary counseling and surveillance: Secondary | ICD-10-CM | POA: Diagnosis not present

## 2019-09-26 DIAGNOSIS — Z299 Encounter for prophylactic measures, unspecified: Secondary | ICD-10-CM | POA: Diagnosis not present

## 2019-09-26 DIAGNOSIS — E7800 Pure hypercholesterolemia, unspecified: Secondary | ICD-10-CM | POA: Diagnosis not present

## 2019-09-26 DIAGNOSIS — Z6831 Body mass index (BMI) 31.0-31.9, adult: Secondary | ICD-10-CM | POA: Diagnosis not present

## 2019-10-19 DIAGNOSIS — I10 Essential (primary) hypertension: Secondary | ICD-10-CM | POA: Diagnosis not present

## 2019-11-20 DIAGNOSIS — I10 Essential (primary) hypertension: Secondary | ICD-10-CM | POA: Diagnosis not present

## 2019-12-20 DIAGNOSIS — I10 Essential (primary) hypertension: Secondary | ICD-10-CM | POA: Diagnosis not present

## 2020-01-19 DIAGNOSIS — I10 Essential (primary) hypertension: Secondary | ICD-10-CM | POA: Diagnosis not present

## 2020-01-22 DIAGNOSIS — M25561 Pain in right knee: Secondary | ICD-10-CM | POA: Diagnosis not present

## 2020-01-22 DIAGNOSIS — M170 Bilateral primary osteoarthritis of knee: Secondary | ICD-10-CM | POA: Diagnosis not present

## 2020-01-22 DIAGNOSIS — S83249A Other tear of medial meniscus, current injury, unspecified knee, initial encounter: Secondary | ICD-10-CM | POA: Diagnosis not present

## 2020-01-22 DIAGNOSIS — M25562 Pain in left knee: Secondary | ICD-10-CM | POA: Diagnosis not present

## 2020-02-19 DIAGNOSIS — I10 Essential (primary) hypertension: Secondary | ICD-10-CM | POA: Diagnosis not present

## 2020-03-17 DIAGNOSIS — Z6830 Body mass index (BMI) 30.0-30.9, adult: Secondary | ICD-10-CM | POA: Diagnosis not present

## 2020-03-17 DIAGNOSIS — Z1339 Encounter for screening examination for other mental health and behavioral disorders: Secondary | ICD-10-CM | POA: Diagnosis not present

## 2020-03-17 DIAGNOSIS — Z0000 Encounter for general adult medical examination without abnormal findings: Secondary | ICD-10-CM | POA: Diagnosis not present

## 2020-03-17 DIAGNOSIS — Z79899 Other long term (current) drug therapy: Secondary | ICD-10-CM | POA: Diagnosis not present

## 2020-03-17 DIAGNOSIS — E7800 Pure hypercholesterolemia, unspecified: Secondary | ICD-10-CM | POA: Diagnosis not present

## 2020-03-17 DIAGNOSIS — Z7189 Other specified counseling: Secondary | ICD-10-CM | POA: Diagnosis not present

## 2020-03-17 DIAGNOSIS — R5383 Other fatigue: Secondary | ICD-10-CM | POA: Diagnosis not present

## 2020-03-17 DIAGNOSIS — I10 Essential (primary) hypertension: Secondary | ICD-10-CM | POA: Diagnosis not present

## 2020-03-17 DIAGNOSIS — Z299 Encounter for prophylactic measures, unspecified: Secondary | ICD-10-CM | POA: Diagnosis not present

## 2020-03-17 DIAGNOSIS — Z1331 Encounter for screening for depression: Secondary | ICD-10-CM | POA: Diagnosis not present

## 2020-03-20 DIAGNOSIS — I10 Essential (primary) hypertension: Secondary | ICD-10-CM | POA: Diagnosis not present

## 2020-04-21 DIAGNOSIS — I10 Essential (primary) hypertension: Secondary | ICD-10-CM | POA: Diagnosis not present

## 2020-05-19 DIAGNOSIS — I10 Essential (primary) hypertension: Secondary | ICD-10-CM | POA: Diagnosis not present

## 2020-06-19 DIAGNOSIS — I10 Essential (primary) hypertension: Secondary | ICD-10-CM | POA: Diagnosis not present

## 2020-07-18 DIAGNOSIS — I10 Essential (primary) hypertension: Secondary | ICD-10-CM | POA: Diagnosis not present

## 2020-07-30 DIAGNOSIS — E669 Obesity, unspecified: Secondary | ICD-10-CM | POA: Diagnosis not present

## 2020-07-30 DIAGNOSIS — Z299 Encounter for prophylactic measures, unspecified: Secondary | ICD-10-CM | POA: Diagnosis not present

## 2020-07-30 DIAGNOSIS — I10 Essential (primary) hypertension: Secondary | ICD-10-CM | POA: Diagnosis not present

## 2020-09-25 DIAGNOSIS — H43821 Vitreomacular adhesion, right eye: Secondary | ICD-10-CM | POA: Diagnosis not present

## 2020-09-25 DIAGNOSIS — H40013 Open angle with borderline findings, low risk, bilateral: Secondary | ICD-10-CM | POA: Diagnosis not present

## 2020-09-25 DIAGNOSIS — D3192 Benign neoplasm of unspecified part of left eye: Secondary | ICD-10-CM | POA: Diagnosis not present

## 2020-09-25 DIAGNOSIS — H04123 Dry eye syndrome of bilateral lacrimal glands: Secondary | ICD-10-CM | POA: Diagnosis not present

## 2020-09-25 DIAGNOSIS — H2513 Age-related nuclear cataract, bilateral: Secondary | ICD-10-CM | POA: Diagnosis not present

## 2020-10-14 ENCOUNTER — Encounter (INDEPENDENT_AMBULATORY_CARE_PROVIDER_SITE_OTHER): Payer: Self-pay | Admitting: *Deleted

## 2020-10-27 DIAGNOSIS — J019 Acute sinusitis, unspecified: Secondary | ICD-10-CM | POA: Diagnosis not present

## 2020-10-27 DIAGNOSIS — Z299 Encounter for prophylactic measures, unspecified: Secondary | ICD-10-CM | POA: Diagnosis not present

## 2020-11-04 DIAGNOSIS — H6592 Unspecified nonsuppurative otitis media, left ear: Secondary | ICD-10-CM | POA: Diagnosis not present

## 2020-11-04 DIAGNOSIS — I10 Essential (primary) hypertension: Secondary | ICD-10-CM | POA: Diagnosis not present

## 2020-11-04 DIAGNOSIS — Z299 Encounter for prophylactic measures, unspecified: Secondary | ICD-10-CM | POA: Diagnosis not present

## 2020-11-04 DIAGNOSIS — H9319 Tinnitus, unspecified ear: Secondary | ICD-10-CM | POA: Diagnosis not present

## 2020-11-18 DIAGNOSIS — I10 Essential (primary) hypertension: Secondary | ICD-10-CM | POA: Diagnosis not present

## 2020-11-18 DIAGNOSIS — H6592 Unspecified nonsuppurative otitis media, left ear: Secondary | ICD-10-CM | POA: Diagnosis not present

## 2020-11-18 DIAGNOSIS — Z299 Encounter for prophylactic measures, unspecified: Secondary | ICD-10-CM | POA: Diagnosis not present

## 2020-11-18 DIAGNOSIS — H9319 Tinnitus, unspecified ear: Secondary | ICD-10-CM | POA: Diagnosis not present

## 2020-11-18 DIAGNOSIS — E7800 Pure hypercholesterolemia, unspecified: Secondary | ICD-10-CM | POA: Diagnosis not present

## 2020-12-11 ENCOUNTER — Encounter (INDEPENDENT_AMBULATORY_CARE_PROVIDER_SITE_OTHER): Payer: Self-pay

## 2020-12-11 ENCOUNTER — Telehealth (INDEPENDENT_AMBULATORY_CARE_PROVIDER_SITE_OTHER): Payer: Self-pay

## 2020-12-11 ENCOUNTER — Other Ambulatory Visit (INDEPENDENT_AMBULATORY_CARE_PROVIDER_SITE_OTHER): Payer: Self-pay

## 2020-12-11 DIAGNOSIS — Z86010 Personal history of colonic polyps: Secondary | ICD-10-CM

## 2020-12-11 MED ORDER — PEG 3350-KCL-NA BICARB-NACL 420 G PO SOLR: 4000.0000 mL | ORAL | 0 refills | Status: AC

## 2020-12-11 NOTE — Telephone Encounter (Signed)
Referring MD/PCP: Woody Seller  Procedure: Tcs   Reason/Indication:  History of Polyps  Has patient had this procedure before?  yes  If so, when, by whom and where?  2017  Is there a family history of colon cancer?  no  Who?  What age when diagnosed?    Is patient diabetic? If yes, Type 1 or Type 2   no      Does patient have prosthetic heart valve or mechanical valve?  no  Do you have a pacemaker/defibrillator?  no  Has patient ever had endocarditis/atrial fibrillation? no  Does patient use oxygen? no  Has patient had joint replacement within last 12 months?  no  Is patient constipated or do they take laxatives? no  Does patient have a history of alcohol/drug use?  no  Have you had a stroke/heart attack last 6 mths? no  Do you take medicine for weight loss?  no  For female patients,: do you still have your menstrual cycle? no  Is patient on blood thinner such as Coumadin, Plavix and/or Aspirin? no  Medications: MVI daily, CoQ 10 200 mg daily, Vit C 1000 mg daily, Vit D3 50 mcg daily, Cherry extract 1200 mg daily, Cranberry 15,000 mg daily, Tums 1000 mg daily, ES Tylenol bid, Ocuvite daily, Claritin 10 mg prn, glucosamine/chondroitin 1500/1200 mg bid, fish oil 700 mg daily  Allergies: nkda   Medication Adjustment per Dr Laural Golden none   Procedure date & time: 01/01/21 8:30 am

## 2020-12-11 NOTE — Telephone Encounter (Signed)
Michelle Arias, CMA  

## 2020-12-18 DIAGNOSIS — H6983 Other specified disorders of Eustachian tube, bilateral: Secondary | ICD-10-CM | POA: Diagnosis not present

## 2020-12-18 DIAGNOSIS — H903 Sensorineural hearing loss, bilateral: Secondary | ICD-10-CM | POA: Diagnosis not present

## 2021-01-01 ENCOUNTER — Ambulatory Visit (HOSPITAL_COMMUNITY)
Admission: RE | Admit: 2021-01-01 | Discharge: 2021-01-01 | Disposition: A | Discharge status: Home / Self Care | Payer: PPO | Source: Ambulatory Visit | Attending: Internal Medicine | Admitting: Internal Medicine

## 2021-01-01 ENCOUNTER — Other Ambulatory Visit: Payer: Self-pay

## 2021-01-01 ENCOUNTER — Encounter (HOSPITAL_COMMUNITY)
Admission: RE | Disposition: A | Discharge status: Home / Self Care | Payer: Self-pay | Source: Ambulatory Visit | Attending: Internal Medicine

## 2021-01-01 ENCOUNTER — Encounter (HOSPITAL_COMMUNITY): Payer: Self-pay | Admitting: Internal Medicine

## 2021-01-01 DIAGNOSIS — K5730 Diverticulosis of large intestine without perforation or abscess without bleeding: Secondary | ICD-10-CM | POA: Diagnosis not present

## 2021-01-01 DIAGNOSIS — D122 Benign neoplasm of ascending colon: Secondary | ICD-10-CM | POA: Diagnosis not present

## 2021-01-01 DIAGNOSIS — Z09 Encounter for follow-up examination after completed treatment for conditions other than malignant neoplasm: Secondary | ICD-10-CM | POA: Diagnosis not present

## 2021-01-01 DIAGNOSIS — Z86010 Personal history of colonic polyps: Secondary | ICD-10-CM

## 2021-01-01 DIAGNOSIS — Z1211 Encounter for screening for malignant neoplasm of colon: Secondary | ICD-10-CM | POA: Insufficient documentation

## 2021-01-01 DIAGNOSIS — K6289 Other specified diseases of anus and rectum: Secondary | ICD-10-CM | POA: Diagnosis not present

## 2021-01-01 DIAGNOSIS — K644 Residual hemorrhoidal skin tags: Secondary | ICD-10-CM | POA: Diagnosis not present

## 2021-01-01 HISTORY — PX: POLYPECTOMY: SHX5525

## 2021-01-01 HISTORY — PX: COLONOSCOPY: SHX5424

## 2021-01-01 LAB — HM COLONOSCOPY

## 2021-01-01 SURGERY — COLONOSCOPY
Anesthesia: Moderate Sedation

## 2021-01-01 MED ORDER — MEPERIDINE HCL 50 MG/ML IJ SOLN
INTRAMUSCULAR | Status: DC | PRN
  Administered 2021-01-01 (×2): 25 mg via INTRAVENOUS

## 2021-01-01 MED ORDER — SODIUM CHLORIDE 0.9 % IV SOLN: INTRAVENOUS | Status: DC

## 2021-01-01 MED ORDER — MEPERIDINE HCL 50 MG/ML IJ SOLN
INTRAMUSCULAR | Status: AC
  Filled 2021-01-01: qty 1

## 2021-01-01 MED ORDER — STERILE WATER FOR IRRIGATION IR SOLN
Status: DC | PRN
  Administered 2021-01-01: 1.5 mL

## 2021-01-01 MED ORDER — MIDAZOLAM HCL 5 MG/5ML IJ SOLN
INTRAMUSCULAR | Status: AC
  Filled 2021-01-01: qty 10

## 2021-01-01 MED ORDER — MIDAZOLAM HCL 5 MG/5ML IJ SOLN
INTRAMUSCULAR | Status: DC | PRN
  Administered 2021-01-01: 1 mg via INTRAVENOUS
  Administered 2021-01-01 (×2): 2 mg via INTRAVENOUS

## 2021-01-01 NOTE — Op Note (Signed)
[facility] Patient Name: Michelle Arias Procedure Date: 01/01/2021 8:21 AM MRN: [id number] Date of Birth: [birth year] Attending MD: Hildred Laser , MD CSN: [id number] Age: 75 Admit Type: Outpatient Procedure:                Colonoscopy Indications:              High risk colon cancer surveillance: Personal                            history of colonic polyps Providers:                Hildred Laser, MD, Charlsie Quest. Insurance claims handler, Therapist, sports,                            Suzan Garibaldi. Risa Grill, Technician Referring MD:             Glenda Chroman, MD Medicines:                Meperidine 50 mg IV, Midazolam 5 mg IV Complications:            No immediate complications. Estimated Blood Loss:     Estimated blood loss was minimal. Procedure:                Pre-Anesthesia Assessment:                           - Prior to the procedure, a History and Physical                            was performed, and patient medications and                            allergies were reviewed. The patient's tolerance of                            previous anesthesia was also reviewed. The risks                            and benefits of the procedure and the sedation                            options and risks were discussed with the patient.                            All questions were answered, and informed consent                            was obtained. Prior Anticoagulants: The patient has                            taken no previous anticoagulant or antiplatelet                            agents. ASA Grade Assessment: I - A normal, healthy  patient. After reviewing the risks and benefits,                            the patient was deemed in satisfactory condition to                            undergo the procedure.                           After obtaining informed consent, the colonoscope                            was passed under direct vision. Throughout the                             procedure, the patient's blood pressure, pulse, and                            oxygen saturations were monitored continuously. The                            [id number] ([id number]) was introduced through the                            anus and advanced to the the cecum, identified by                            appendiceal orifice and ileocecal valve. The                            colonoscopy was performed without difficulty. The                            patient tolerated the procedure well. The quality                            of the bowel preparation was good. The ileocecal                            valve, appendiceal orifice, and rectum were                            photographed. Scope In: 8:47:01 AM Scope Out: 9:07:23 AM Scope Withdrawal Time: 0 hours 14 minutes 50 seconds  Total Procedure Duration: 0 hours 20 minutes 22 seconds  Findings:      The perianal and digital rectal examinations were normal.      A 4 to 6 mm polyp was found in the mid ascending colon. The polyp was       sessile. The polyp was removed with a cold snare. Resection and       retrieval were complete. The pathology specimen was placed into Bottle       Number 1.      A few small-mouthed diverticula were found in the sigmoid colon and       hepatic flexure.  External hemorrhoids were found during retroflexion. The hemorrhoids       were small.      Anal papilla(e) were hypertrophied. Impression:               - One 4 to 6 mm polyp in the mid ascending colon,                            removed with a cold snare. Resected and retrieved.                           - Diverticulosis in the sigmoid colon and at the                            hepatic flexure.                           - External hemorrhoids.                           - Anal papilla(e) were hypertrophied. Moderate Sedation:      Moderate (conscious) sedation was administered by the endoscopy nurse       and supervised by the endoscopist.  The following parameters were       monitored: oxygen saturation, heart rate, blood pressure, CO2       capnography and response to care. Total physician intraservice time was       25 minutes. Recommendation:           - Patient has a contact number available for                            emergencies. The signs and symptoms of potential                            delayed complications were discussed with the                            patient. Return to normal activities tomorrow.                            Written discharge instructions were provided to the                            patient.                           - High fiber diet today.                           - Continue present medications.                           - No aspirin, ibuprofen, naproxen, or other                            non-steroidal anti-inflammatory drugs for 1 day.                           -  Await pathology results.                           - Repeat colonoscopy is recommended. The                            colonoscopy date will be determined after pathology                            results from today's exam become available for                            review. Procedure Code(s):        --- Professional ---                           [phone], Colonoscopy, flexible; with removal of                            tumor(s), polyp(s), or other lesion(s) by snare                            technique                           99153, Moderate sedation; each additional 15                            minutes intraservice time                           G0500, Moderate sedation services provided by the                            same physician or other qualified health care                            professional performing a gastrointestinal                            endoscopic service that sedation supports,                            requiring the presence of an independent trained                             observer to assist in the monitoring of the                            patient's level of consciousness and physiological                            status; initial 15 minutes of intra-service time;                            patient age 46 years or older (additional  time may                            be reported with [phone], as appropriate) Diagnosis Code(s):        --- Professional ---                           Z86.010, Personal history of colonic polyps                           K63.5, Polyp of colon                           K64.4, Residual hemorrhoidal skin tags                           K62.89, Other specified diseases of anus and rectum                           K57.30, Diverticulosis of large intestine without                            perforation or abscess without bleeding CPT copyright 2019 [employer]. All rights reserved. The codes documented in this report are preliminary and upon coder review may  be revised to meet current compliance requirements. Hildred Laser, MD Hildred Laser, MD 01/01/2021 9:16:02 AM This report has been signed electronically. Number of Addenda: 0

## 2021-01-01 NOTE — H&P (Signed)
Michelle Arias is an 75 y.o. female.   Chief Complaint: Patient is here for colonoscopy. HPI: Patient is 75 year old Caucasian female who is here for surveillance colonoscopy.  Her last colonoscopy was in August 2017 with removal of 2 cecal polyps which turned out to be sessile serrated polyps.  She denies abdominal pain change in bowel habits or rectal bleeding.  She does not take aspirin or anticoagulants. Family history is negative for CRC.  History reviewed. No pertinent past medical history.  Past Surgical History:  Procedure Laterality Date   ABDOMINAL HYSTERECTOMY     APPENDECTOMY  1990   COLONOSCOPY N/A 10/30/2015   Procedure: COLONOSCOPY;  Surgeon: Rogene Houston, MD;  Location: [facility];  Service: Endoscopy;  Laterality: N/A;  930   POLYPECTOMY  10/30/2015   Procedure: POLYPECTOMY;  Surgeon: Rogene Houston, MD;  Location: [facility];  Service: Endoscopy;;  colon     History reviewed. No pertinent family history. Social History:  reports that she has never smoked. She has never used smokeless tobacco. She reports that she does not drink alcohol and does not use drugs.  Allergies: No Known Allergies  Medications Prior to Admission  Medication Sig Dispense Refill   acetaminophen (TYLENOL) 500 MG tablet Take 500 mg by mouth in the morning and at bedtime.     APPLE CIDER VINEGAR PO Take 480 mg by mouth in the morning, at noon, in the evening, and at bedtime.     Ascorbic Acid (VITAMIN C) 1000 MG tablet Take 1,000 mg by mouth daily.     calcium elemental as carbonate (BARIATRIC TUMS ULTRA) 400 MG chewable tablet Chew 1 tablet by mouth every evening.     Cholecalciferol (VITAMIN D) 50 MCG (2000 UT) tablet Take 2,000 Units by mouth daily.     Coenzyme Q10 200 MG capsule Take 200 mg by mouth daily.     CRANBERRY PO Take 15,000 mg by mouth daily.     diclofenac Sodium (VOLTAREN) 1 % GEL Apply 1 application topically daily as needed (knee pain).     estradiol (ESTRACE) 1 MG  tablet Take 0.5 mg by mouth daily.     loratadine (CLARITIN) 10 MG tablet Take 10 mg by mouth daily.     Multiple Vitamin (MULTIVITAMIN) tablet Take 1 tablet by mouth daily.     multivitamin-lutein (OCUVITE-LUTEIN) CAPS capsule Take 1 capsule by mouth daily.     Omega-3 Fatty Acids (FISH OIL PO) Take 700 mg by mouth daily.     sodium chloride (OCEAN) 0.65 % SOLN nasal spray Place 1 spray into both nostrils as needed for congestion.     Tart Cherry 1200 MG CAPS Take 1,200 mg by mouth daily.     TURMERIC PO Take 1,500 mg by mouth daily.     vitamin B-12 (CYANOCOBALAMIN) 1000 MCG tablet Take 1,000 mcg by mouth daily.     polyethylene glycol-electrolytes (TRILYTE) 420 g solution Take 4,000 mLs by mouth as directed. 4000 mL 0    No results found for this or any previous visit (from the past 48 hour(s)). No results found.  Review of Systems  Blood pressure (!) 170/76, pulse 98, temperature 98.4 F (36.9 C), temperature source Oral, resp. rate 14, height 5\' 5"  (1.651 m), weight 77.1 kg, SpO2 98 %. Physical Exam HENT:     Mouth/Throat:     Mouth: Mucous membranes are moist.     Pharynx: Oropharynx is clear.  Eyes:     General:  No scleral icterus.    Conjunctiva/sclera: Conjunctivae normal.  Cardiovascular:     Rate and Rhythm: Normal rate and regular rhythm.     Heart sounds: Normal heart sounds. No murmur heard. Pulmonary:     Effort: Pulmonary effort is normal.     Breath sounds: Normal breath sounds.  Abdominal:     General: There is no distension.     Palpations: Abdomen is soft. There is no mass.     Tenderness: There is no abdominal tenderness.  Musculoskeletal:     Cervical back: Neck supple.  Lymphadenopathy:     Cervical: No cervical adenopathy.  Neurological:     Mental Status: She is alert.     Assessment/Plan  History of colonic polyps Surveillance colonoscopy.  Hildred Laser, MD 01/01/2021, 8:37 AM

## 2021-01-01 NOTE — Discharge Instructions (Addendum)
No aspirin or NSAIDs for 24 hours.   °Resume usual medications as before. °High-fiber diet. °No driving for 24 hours. °Physician will call with biopsy results. °

## 2021-01-05 LAB — SURGICAL PATHOLOGY

## 2021-01-06 ENCOUNTER — Encounter (INDEPENDENT_AMBULATORY_CARE_PROVIDER_SITE_OTHER): Payer: Self-pay | Admitting: *Deleted

## 2021-01-06 ENCOUNTER — Encounter (HOSPITAL_COMMUNITY): Payer: Self-pay | Admitting: Internal Medicine

## 2021-03-02 DIAGNOSIS — Z1331 Encounter for screening for depression: Secondary | ICD-10-CM | POA: Diagnosis not present

## 2021-03-02 DIAGNOSIS — Z87891 Personal history of nicotine dependence: Secondary | ICD-10-CM | POA: Diagnosis not present

## 2021-03-02 DIAGNOSIS — Z6829 Body mass index (BMI) 29.0-29.9, adult: Secondary | ICD-10-CM | POA: Diagnosis not present

## 2021-03-02 DIAGNOSIS — Z299 Encounter for prophylactic measures, unspecified: Secondary | ICD-10-CM | POA: Diagnosis not present

## 2021-03-02 DIAGNOSIS — Z79899 Other long term (current) drug therapy: Secondary | ICD-10-CM | POA: Diagnosis not present

## 2021-03-02 DIAGNOSIS — I10 Essential (primary) hypertension: Secondary | ICD-10-CM | POA: Diagnosis not present

## 2021-03-02 DIAGNOSIS — Z7189 Other specified counseling: Secondary | ICD-10-CM | POA: Diagnosis not present

## 2021-03-02 DIAGNOSIS — Z0000 Encounter for general adult medical examination without abnormal findings: Secondary | ICD-10-CM | POA: Diagnosis not present

## 2021-03-02 DIAGNOSIS — R5383 Other fatigue: Secondary | ICD-10-CM | POA: Diagnosis not present

## 2021-03-02 DIAGNOSIS — E7800 Pure hypercholesterolemia, unspecified: Secondary | ICD-10-CM | POA: Diagnosis not present

## 2021-03-02 DIAGNOSIS — Z1339 Encounter for screening examination for other mental health and behavioral disorders: Secondary | ICD-10-CM | POA: Diagnosis not present

## 2021-04-21 DIAGNOSIS — E782 Mixed hyperlipidemia: Secondary | ICD-10-CM | POA: Diagnosis not present

## 2021-04-21 DIAGNOSIS — I10 Essential (primary) hypertension: Secondary | ICD-10-CM | POA: Diagnosis not present

## 2021-06-12 DIAGNOSIS — N951 Menopausal and female climacteric states: Secondary | ICD-10-CM | POA: Diagnosis not present

## 2021-06-12 DIAGNOSIS — L819 Disorder of pigmentation, unspecified: Secondary | ICD-10-CM | POA: Diagnosis not present

## 2021-06-12 DIAGNOSIS — Z90710 Acquired absence of both cervix and uterus: Secondary | ICD-10-CM | POA: Diagnosis not present

## 2021-06-12 DIAGNOSIS — Z01419 Encounter for gynecological examination (general) (routine) without abnormal findings: Secondary | ICD-10-CM | POA: Diagnosis not present

## 2021-07-02 ENCOUNTER — Other Ambulatory Visit: Payer: Self-pay | Admitting: Internal Medicine

## 2021-07-02 DIAGNOSIS — Z1231 Encounter for screening mammogram for malignant neoplasm of breast: Secondary | ICD-10-CM

## 2021-07-06 DIAGNOSIS — Z713 Dietary counseling and surveillance: Secondary | ICD-10-CM | POA: Diagnosis not present

## 2021-07-06 DIAGNOSIS — Z6829 Body mass index (BMI) 29.0-29.9, adult: Secondary | ICD-10-CM | POA: Diagnosis not present

## 2021-07-06 DIAGNOSIS — Z299 Encounter for prophylactic measures, unspecified: Secondary | ICD-10-CM | POA: Diagnosis not present

## 2021-07-06 DIAGNOSIS — K219 Gastro-esophageal reflux disease without esophagitis: Secondary | ICD-10-CM | POA: Diagnosis not present

## 2021-07-06 DIAGNOSIS — I10 Essential (primary) hypertension: Secondary | ICD-10-CM | POA: Diagnosis not present

## 2021-07-09 ENCOUNTER — Ambulatory Visit
Admission: RE | Admit: 2021-07-09 | Discharge: 2021-07-09 | Disposition: A | Discharge status: Home / Self Care | Payer: PPO | Source: Ambulatory Visit | Attending: Internal Medicine | Admitting: Internal Medicine

## 2021-07-09 DIAGNOSIS — Z1231 Encounter for screening mammogram for malignant neoplasm of breast: Secondary | ICD-10-CM

## 2021-07-23 DIAGNOSIS — L821 Other seborrheic keratosis: Secondary | ICD-10-CM | POA: Diagnosis not present

## 2021-07-23 DIAGNOSIS — D225 Melanocytic nevi of trunk: Secondary | ICD-10-CM | POA: Diagnosis not present

## 2021-07-23 DIAGNOSIS — Z1283 Encounter for screening for malignant neoplasm of skin: Secondary | ICD-10-CM | POA: Diagnosis not present

## 2021-09-30 DIAGNOSIS — H2513 Age-related nuclear cataract, bilateral: Secondary | ICD-10-CM | POA: Diagnosis not present

## 2021-09-30 DIAGNOSIS — H40013 Open angle with borderline findings, low risk, bilateral: Secondary | ICD-10-CM | POA: Diagnosis not present

## 2021-09-30 DIAGNOSIS — D3192 Benign neoplasm of unspecified part of left eye: Secondary | ICD-10-CM | POA: Diagnosis not present

## 2021-09-30 DIAGNOSIS — H43821 Vitreomacular adhesion, right eye: Secondary | ICD-10-CM | POA: Diagnosis not present

## 2021-09-30 DIAGNOSIS — H04123 Dry eye syndrome of bilateral lacrimal glands: Secondary | ICD-10-CM | POA: Diagnosis not present

## 2021-11-09 DIAGNOSIS — Z1339 Encounter for screening examination for other mental health and behavioral disorders: Secondary | ICD-10-CM | POA: Diagnosis not present

## 2021-11-09 DIAGNOSIS — Z1331 Encounter for screening for depression: Secondary | ICD-10-CM | POA: Diagnosis not present

## 2021-11-09 DIAGNOSIS — I10 Essential (primary) hypertension: Secondary | ICD-10-CM | POA: Diagnosis not present

## 2021-11-09 DIAGNOSIS — E8940 Asymptomatic postprocedural ovarian failure: Secondary | ICD-10-CM | POA: Diagnosis not present

## 2021-11-09 DIAGNOSIS — Z6829 Body mass index (BMI) 29.0-29.9, adult: Secondary | ICD-10-CM | POA: Diagnosis not present

## 2021-11-09 DIAGNOSIS — Z0000 Encounter for general adult medical examination without abnormal findings: Secondary | ICD-10-CM | POA: Diagnosis not present

## 2021-11-09 DIAGNOSIS — Z299 Encounter for prophylactic measures, unspecified: Secondary | ICD-10-CM | POA: Diagnosis not present

## 2021-11-09 DIAGNOSIS — Z7189 Other specified counseling: Secondary | ICD-10-CM | POA: Diagnosis not present

## 2022-02-22 DIAGNOSIS — Z87891 Personal history of nicotine dependence: Secondary | ICD-10-CM | POA: Diagnosis not present

## 2022-02-22 DIAGNOSIS — Z0000 Encounter for general adult medical examination without abnormal findings: Secondary | ICD-10-CM | POA: Diagnosis not present

## 2022-02-22 DIAGNOSIS — Z299 Encounter for prophylactic measures, unspecified: Secondary | ICD-10-CM | POA: Diagnosis not present

## 2022-02-22 DIAGNOSIS — R5383 Other fatigue: Secondary | ICD-10-CM | POA: Diagnosis not present

## 2022-02-22 DIAGNOSIS — E7800 Pure hypercholesterolemia, unspecified: Secondary | ICD-10-CM | POA: Diagnosis not present

## 2022-02-22 DIAGNOSIS — I10 Essential (primary) hypertension: Secondary | ICD-10-CM | POA: Diagnosis not present

## 2022-02-22 DIAGNOSIS — Z6830 Body mass index (BMI) 30.0-30.9, adult: Secondary | ICD-10-CM | POA: Diagnosis not present

## 2022-02-22 DIAGNOSIS — Z79899 Other long term (current) drug therapy: Secondary | ICD-10-CM | POA: Diagnosis not present

## 2022-05-28 ENCOUNTER — Other Ambulatory Visit: Payer: Self-pay | Admitting: Internal Medicine

## 2022-05-28 DIAGNOSIS — Z1231 Encounter for screening mammogram for malignant neoplasm of breast: Secondary | ICD-10-CM

## 2022-06-09 DIAGNOSIS — Z1331 Encounter for screening for depression: Secondary | ICD-10-CM | POA: Diagnosis not present

## 2022-06-09 DIAGNOSIS — Z1339 Encounter for screening examination for other mental health and behavioral disorders: Secondary | ICD-10-CM | POA: Diagnosis not present

## 2022-06-09 DIAGNOSIS — Z6830 Body mass index (BMI) 30.0-30.9, adult: Secondary | ICD-10-CM | POA: Diagnosis not present

## 2022-06-09 DIAGNOSIS — Z299 Encounter for prophylactic measures, unspecified: Secondary | ICD-10-CM | POA: Diagnosis not present

## 2022-06-09 DIAGNOSIS — Z87891 Personal history of nicotine dependence: Secondary | ICD-10-CM | POA: Diagnosis not present

## 2022-06-09 DIAGNOSIS — I10 Essential (primary) hypertension: Secondary | ICD-10-CM | POA: Diagnosis not present

## 2022-06-09 DIAGNOSIS — Z7189 Other specified counseling: Secondary | ICD-10-CM | POA: Diagnosis not present

## 2022-06-09 DIAGNOSIS — Z0000 Encounter for general adult medical examination without abnormal findings: Secondary | ICD-10-CM | POA: Diagnosis not present

## 2022-07-16 ENCOUNTER — Ambulatory Visit: Payer: PPO

## 2022-07-28 ENCOUNTER — Ambulatory Visit
Admission: RE | Admit: 2022-07-28 | Discharge: 2022-07-28 | Disposition: A | Discharge status: Home / Self Care | Payer: PPO | Source: Ambulatory Visit | Attending: Internal Medicine | Admitting: Internal Medicine

## 2022-07-28 DIAGNOSIS — Z1231 Encounter for screening mammogram for malignant neoplasm of breast: Secondary | ICD-10-CM

## 2022-08-23 DIAGNOSIS — Z90710 Acquired absence of both cervix and uterus: Secondary | ICD-10-CM | POA: Diagnosis not present

## 2022-08-23 DIAGNOSIS — Z01419 Encounter for gynecological examination (general) (routine) without abnormal findings: Secondary | ICD-10-CM | POA: Diagnosis not present

## 2022-08-23 DIAGNOSIS — N951 Menopausal and female climacteric states: Secondary | ICD-10-CM | POA: Diagnosis not present

## 2022-10-05 DIAGNOSIS — H43821 Vitreomacular adhesion, right eye: Secondary | ICD-10-CM | POA: Diagnosis not present

## 2022-10-05 DIAGNOSIS — D3192 Benign neoplasm of unspecified part of left eye: Secondary | ICD-10-CM | POA: Diagnosis not present

## 2022-10-05 DIAGNOSIS — H04123 Dry eye syndrome of bilateral lacrimal glands: Secondary | ICD-10-CM | POA: Diagnosis not present

## 2022-10-05 DIAGNOSIS — H2513 Age-related nuclear cataract, bilateral: Secondary | ICD-10-CM | POA: Diagnosis not present

## 2022-10-05 DIAGNOSIS — H40013 Open angle with borderline findings, low risk, bilateral: Secondary | ICD-10-CM | POA: Diagnosis not present

## 2022-11-11 DIAGNOSIS — Z299 Encounter for prophylactic measures, unspecified: Secondary | ICD-10-CM | POA: Diagnosis not present

## 2022-11-11 DIAGNOSIS — M19049 Primary osteoarthritis, unspecified hand: Secondary | ICD-10-CM | POA: Diagnosis not present

## 2022-11-11 DIAGNOSIS — E7800 Pure hypercholesterolemia, unspecified: Secondary | ICD-10-CM | POA: Diagnosis not present

## 2022-11-11 DIAGNOSIS — R5383 Other fatigue: Secondary | ICD-10-CM | POA: Diagnosis not present

## 2022-11-11 DIAGNOSIS — Z0000 Encounter for general adult medical examination without abnormal findings: Secondary | ICD-10-CM | POA: Diagnosis not present

## 2022-11-11 DIAGNOSIS — I10 Essential (primary) hypertension: Secondary | ICD-10-CM | POA: Diagnosis not present

## 2023-03-18 DIAGNOSIS — J019 Acute sinusitis, unspecified: Secondary | ICD-10-CM | POA: Diagnosis not present

## 2023-03-18 DIAGNOSIS — R0981 Nasal congestion: Secondary | ICD-10-CM | POA: Diagnosis not present

## 2023-03-18 DIAGNOSIS — Z299 Encounter for prophylactic measures, unspecified: Secondary | ICD-10-CM | POA: Diagnosis not present

## 2023-04-14 DIAGNOSIS — H43821 Vitreomacular adhesion, right eye: Secondary | ICD-10-CM | POA: Diagnosis not present

## 2023-04-14 DIAGNOSIS — H2513 Age-related nuclear cataract, bilateral: Secondary | ICD-10-CM | POA: Diagnosis not present

## 2023-04-14 DIAGNOSIS — D3192 Benign neoplasm of unspecified part of left eye: Secondary | ICD-10-CM | POA: Diagnosis not present

## 2023-04-14 DIAGNOSIS — H40013 Open angle with borderline findings, low risk, bilateral: Secondary | ICD-10-CM | POA: Diagnosis not present

## 2023-04-14 DIAGNOSIS — H04123 Dry eye syndrome of bilateral lacrimal glands: Secondary | ICD-10-CM | POA: Diagnosis not present

## 2023-05-24 DIAGNOSIS — Z79890 Hormone replacement therapy (postmenopausal): Secondary | ICD-10-CM | POA: Diagnosis not present

## 2023-05-24 DIAGNOSIS — Z90710 Acquired absence of both cervix and uterus: Secondary | ICD-10-CM | POA: Diagnosis not present

## 2023-05-24 DIAGNOSIS — N951 Menopausal and female climacteric states: Secondary | ICD-10-CM | POA: Diagnosis not present

## 2023-06-16 ENCOUNTER — Other Ambulatory Visit: Payer: Self-pay | Admitting: Internal Medicine

## 2023-06-16 DIAGNOSIS — Z1231 Encounter for screening mammogram for malignant neoplasm of breast: Secondary | ICD-10-CM

## 2023-06-23 DIAGNOSIS — E8940 Asymptomatic postprocedural ovarian failure: Secondary | ICD-10-CM | POA: Diagnosis not present

## 2023-06-23 DIAGNOSIS — Z7189 Other specified counseling: Secondary | ICD-10-CM | POA: Diagnosis not present

## 2023-06-23 DIAGNOSIS — Z1331 Encounter for screening for depression: Secondary | ICD-10-CM | POA: Diagnosis not present

## 2023-06-23 DIAGNOSIS — Z0000 Encounter for general adult medical examination without abnormal findings: Secondary | ICD-10-CM | POA: Diagnosis not present

## 2023-06-23 DIAGNOSIS — Z87891 Personal history of nicotine dependence: Secondary | ICD-10-CM | POA: Diagnosis not present

## 2023-06-23 DIAGNOSIS — Z1339 Encounter for screening examination for other mental health and behavioral disorders: Secondary | ICD-10-CM | POA: Diagnosis not present

## 2023-06-23 DIAGNOSIS — Z299 Encounter for prophylactic measures, unspecified: Secondary | ICD-10-CM | POA: Diagnosis not present

## 2023-06-23 DIAGNOSIS — I10 Essential (primary) hypertension: Secondary | ICD-10-CM | POA: Diagnosis not present

## 2023-07-29 ENCOUNTER — Ambulatory Visit
Admission: RE | Admit: 2023-07-29 | Discharge: 2023-07-29 | Disposition: A | Discharge status: Home / Self Care | Source: Ambulatory Visit | Attending: Internal Medicine | Admitting: Internal Medicine

## 2023-07-29 DIAGNOSIS — Z1231 Encounter for screening mammogram for malignant neoplasm of breast: Secondary | ICD-10-CM | POA: Diagnosis not present

## 2023-12-26 IMAGING — MG MM DIGITAL SCREENING BILAT W/ TOMO AND CAD
8 series · 9 of 24 positions shown · non-contrast
Comparison: Previous exam(s).

CLINICAL DATA: Screening.

EXAM:
DIGITAL SCREENING BILATERAL MAMMOGRAM WITH TOMOSYNTHESIS AND CAD
TECHNIQUE: Bilateral screening digital craniocaudal and mediolateral oblique
mammograms were obtained. Bilateral screening digital breast
tomosynthesis was performed. The images were evaluated with
computer-aided detection.

[R CC synth-2D]
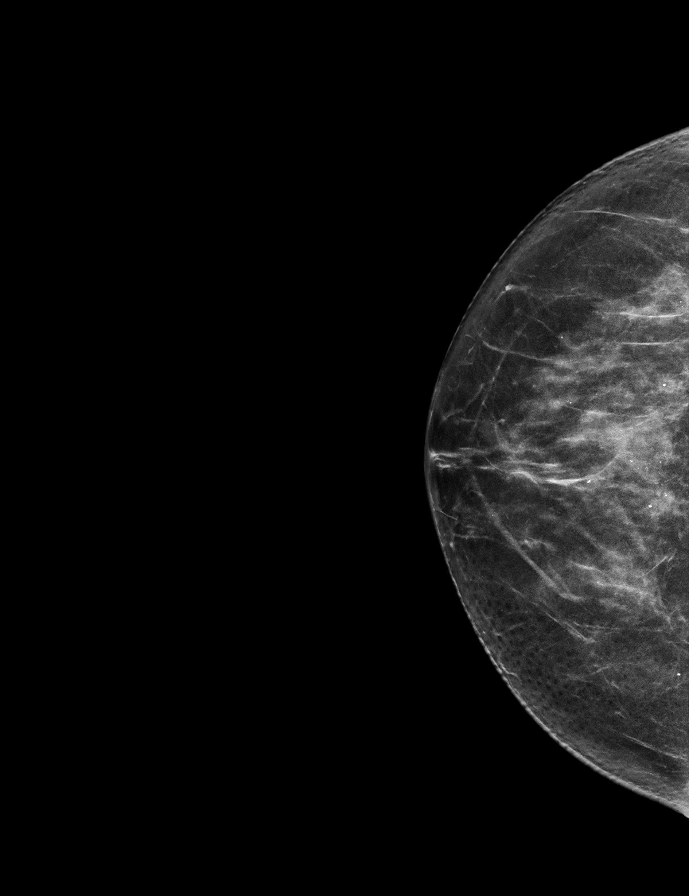

[L CC synth-2D]
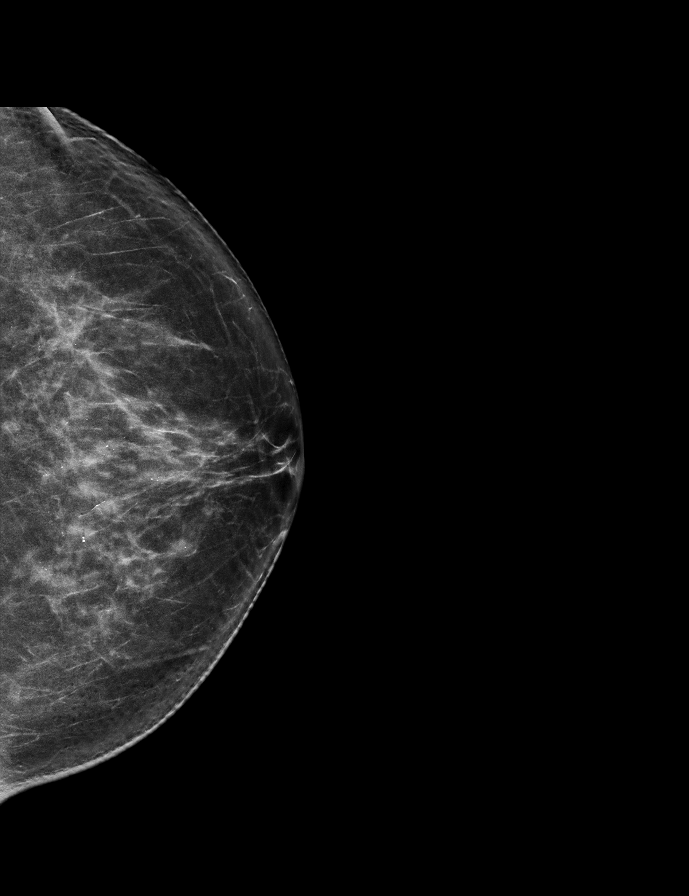

[R MLO synth-2D]
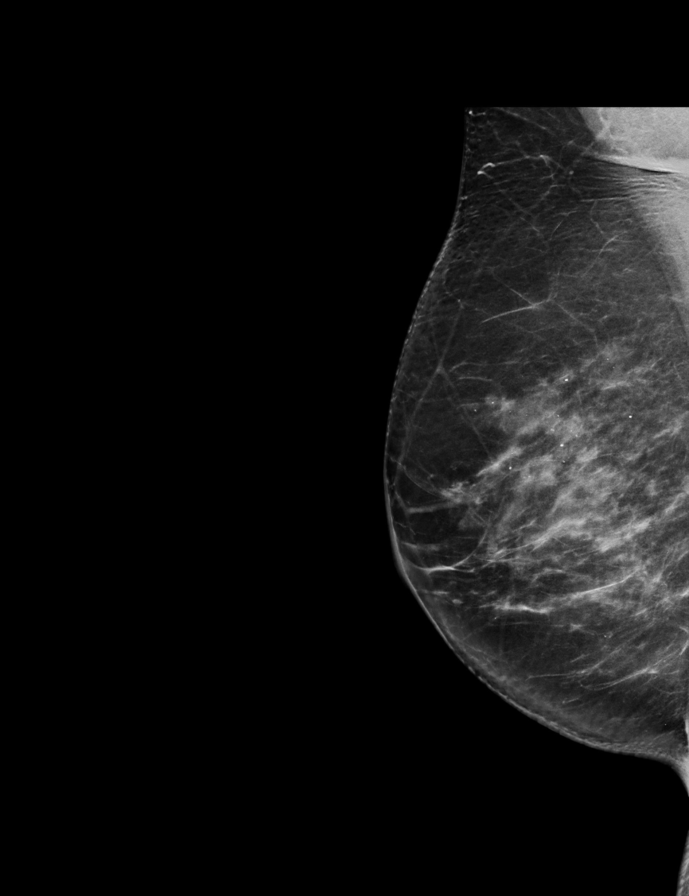

[L MLO synth-2D]
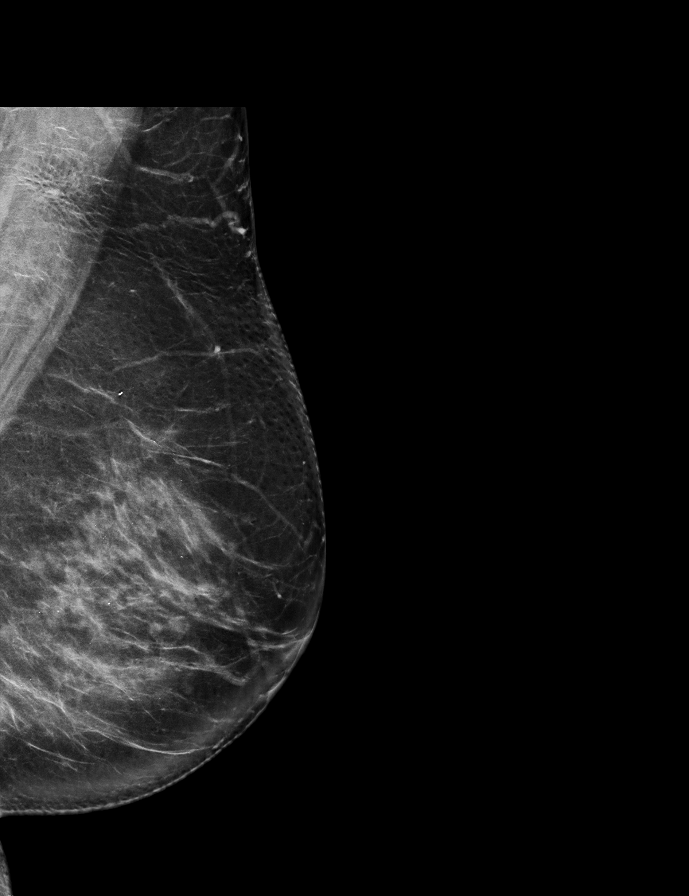

[L MLO tomo · 2 of 82 frames shown]
[frame 27/82]
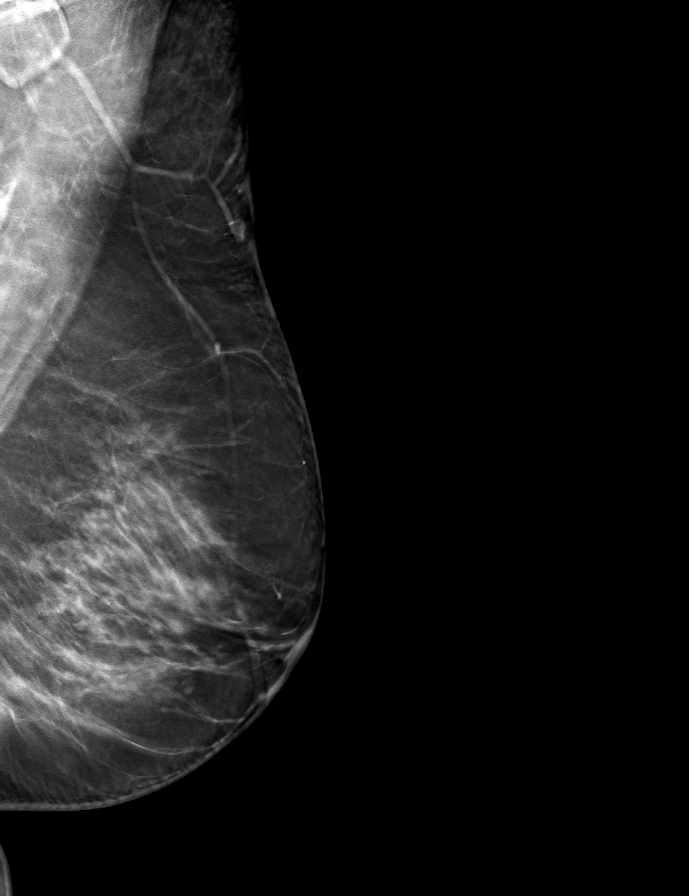
[frame 41/82]
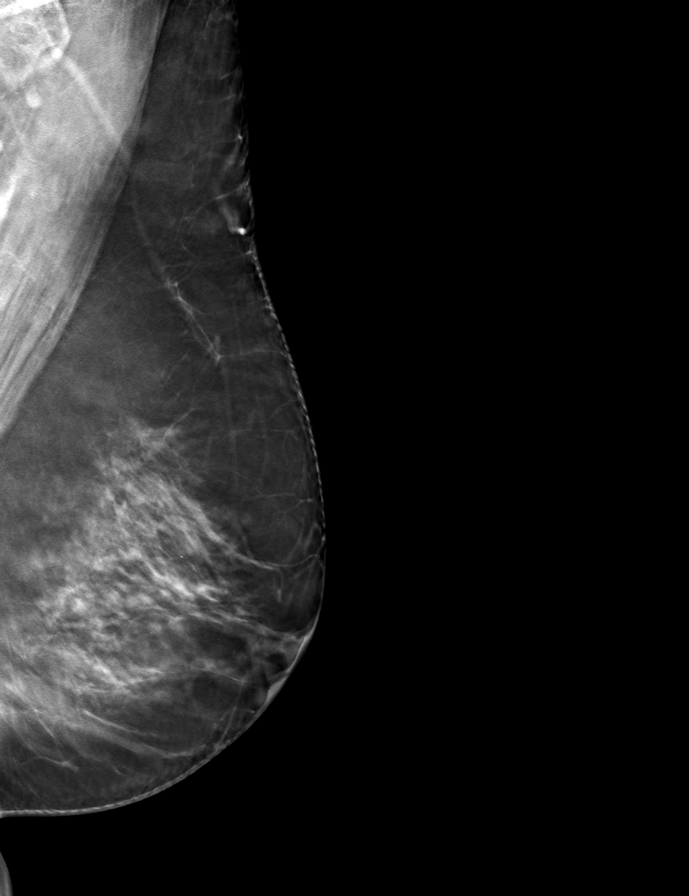

[R CC tomo · tomo slice 31/61.0]
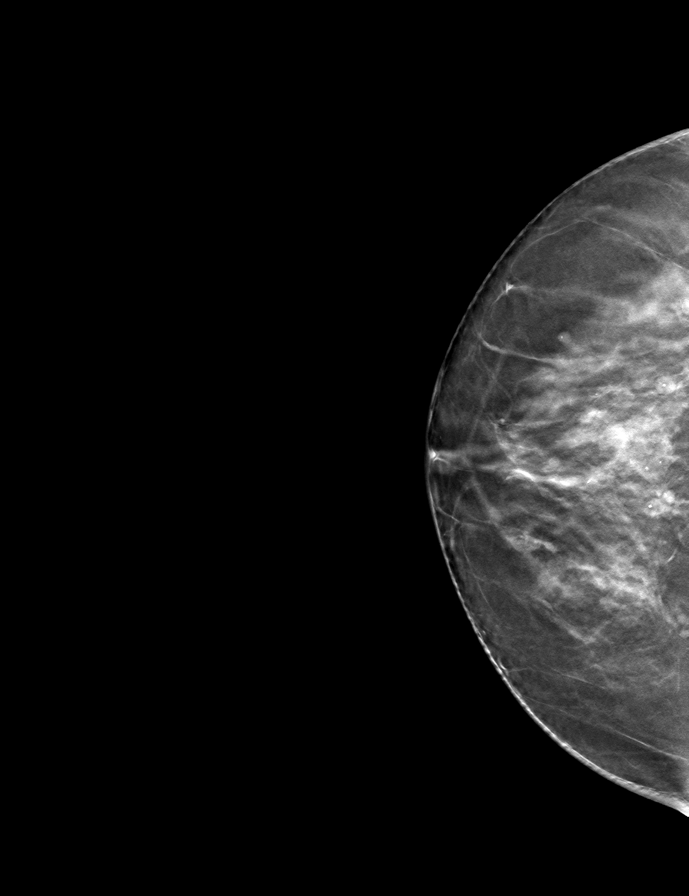

[L CC tomo · tomo slice 35/69.0]
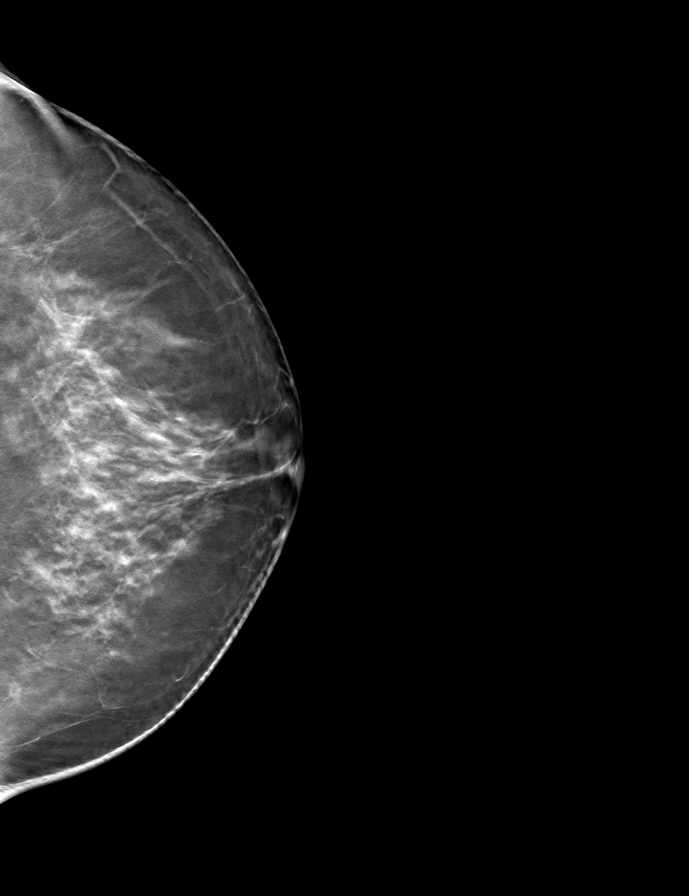

[R MLO tomo · tomo slice 37/73.0]
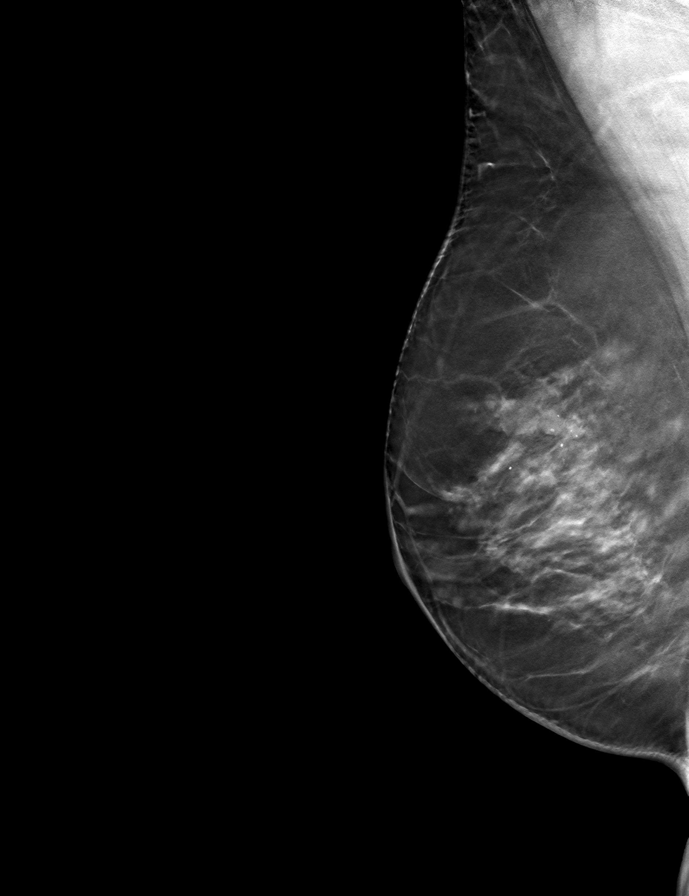

[9 of 24 positions shown; findings below may reference images not displayed]

ACR Breast Density Category c: The breast tissue is heterogeneously
dense, which may obscure small masses.
FINDINGS: There are no findings suspicious for malignancy.
IMPRESSION: No mammographic evidence of malignancy. A result letter of this
screening mammogram will be mailed directly to the patient.

RECOMMENDATION:
Screening mammogram in one year. (Code:Q3-W-BC3)

BI-RADS CATEGORY  1: Negative.

## 2024-01-12 DIAGNOSIS — R030 Elevated blood-pressure reading, without diagnosis of hypertension: Secondary | ICD-10-CM | POA: Diagnosis not present

## 2024-01-12 DIAGNOSIS — N951 Menopausal and female climacteric states: Secondary | ICD-10-CM | POA: Diagnosis not present

## 2024-01-12 DIAGNOSIS — Z90710 Acquired absence of both cervix and uterus: Secondary | ICD-10-CM | POA: Diagnosis not present

## 2024-02-01 DIAGNOSIS — R5383 Other fatigue: Secondary | ICD-10-CM | POA: Diagnosis not present

## 2024-02-01 DIAGNOSIS — E7800 Pure hypercholesterolemia, unspecified: Secondary | ICD-10-CM | POA: Diagnosis not present

## 2024-02-01 DIAGNOSIS — Z79899 Other long term (current) drug therapy: Secondary | ICD-10-CM | POA: Diagnosis not present

## 2024-02-01 DIAGNOSIS — Z299 Encounter for prophylactic measures, unspecified: Secondary | ICD-10-CM | POA: Diagnosis not present

## 2024-02-01 DIAGNOSIS — Z23 Encounter for immunization: Secondary | ICD-10-CM | POA: Diagnosis not present

## 2024-02-01 DIAGNOSIS — R634 Abnormal weight loss: Secondary | ICD-10-CM | POA: Diagnosis not present

## 2024-02-01 DIAGNOSIS — I10 Essential (primary) hypertension: Secondary | ICD-10-CM | POA: Diagnosis not present

## 2024-02-01 DIAGNOSIS — Z0000 Encounter for general adult medical examination without abnormal findings: Secondary | ICD-10-CM | POA: Diagnosis not present
